# Patient Record
Sex: Female | Born: 1990 | Race: White | Hispanic: No | Marital: Married | Smoking: Never smoker
Health system: Southern US, Community
[De-identification: ages and names within clinical notes are randomized; demographics above are authoritative.]

## PROBLEM LIST (undated history)

## (undated) DIAGNOSIS — N83209 Unspecified ovarian cyst, unspecified side: Secondary | ICD-10-CM

## (undated) DIAGNOSIS — J45909 Unspecified asthma, uncomplicated: Secondary | ICD-10-CM

## (undated) HISTORY — PX: OTHER SURGICAL HISTORY: SHX169

---

## 2017-11-27 ENCOUNTER — Other Ambulatory Visit: Payer: Self-pay

## 2017-11-27 ENCOUNTER — Encounter (HOSPITAL_BASED_OUTPATIENT_CLINIC_OR_DEPARTMENT_OTHER): Payer: Self-pay | Admitting: *Deleted

## 2017-11-27 ENCOUNTER — Emergency Department (HOSPITAL_BASED_OUTPATIENT_CLINIC_OR_DEPARTMENT_OTHER): Payer: 59

## 2017-11-27 ENCOUNTER — Emergency Department (HOSPITAL_BASED_OUTPATIENT_CLINIC_OR_DEPARTMENT_OTHER)
Admission: EM | Admit: 2017-11-27 | Discharge: 2017-11-27 | Disposition: A | Discharge status: Home / Self Care | Payer: 59 | Attending: Emergency Medicine | Admitting: Emergency Medicine

## 2017-11-27 DIAGNOSIS — N83201 Unspecified ovarian cyst, right side: Secondary | ICD-10-CM | POA: Insufficient documentation

## 2017-11-27 DIAGNOSIS — J45909 Unspecified asthma, uncomplicated: Secondary | ICD-10-CM | POA: Diagnosis not present

## 2017-11-27 DIAGNOSIS — R1013 Epigastric pain: Secondary | ICD-10-CM | POA: Diagnosis not present

## 2017-11-27 DIAGNOSIS — R10811 Right upper quadrant abdominal tenderness: Secondary | ICD-10-CM | POA: Insufficient documentation

## 2017-11-27 DIAGNOSIS — R197 Diarrhea, unspecified: Secondary | ICD-10-CM | POA: Diagnosis not present

## 2017-11-27 DIAGNOSIS — R112 Nausea with vomiting, unspecified: Secondary | ICD-10-CM

## 2017-11-27 HISTORY — DX: Unspecified ovarian cyst, unspecified side: N83.209

## 2017-11-27 HISTORY — DX: Unspecified asthma, uncomplicated: J45.909

## 2017-11-27 LAB — COMPREHENSIVE METABOLIC PANEL
ALT: 18 U/L (ref 14–54)
AST: 29 U/L (ref 15–41)
Albumin: 4.4 g/dL (ref 3.5–5.0)
Alkaline Phosphatase: 64 U/L (ref 38–126)
Anion gap: 10 (ref 5–15)
BUN: 14 mg/dL (ref 6–20)
CHLORIDE: 105 mmol/L (ref 101–111)
CO2: 21 mmol/L — AB (ref 22–32)
CREATININE: 0.84 mg/dL (ref 0.44–1.00)
Calcium: 9.2 mg/dL (ref 8.9–10.3)
Glucose, Bld: 113 mg/dL — ABNORMAL HIGH (ref 65–99)
POTASSIUM: 4.1 mmol/L (ref 3.5–5.1)
SODIUM: 136 mmol/L (ref 135–145)
Total Bilirubin: 1.5 mg/dL — ABNORMAL HIGH (ref 0.3–1.2)
Total Protein: 7.5 g/dL (ref 6.5–8.1)

## 2017-11-27 LAB — CBC WITH DIFFERENTIAL/PLATELET
BASOS ABS: 0 10*3/uL (ref 0.0–0.1)
Basophils Relative: 0 %
EOS ABS: 0 10*3/uL (ref 0.0–0.7)
EOS PCT: 0 %
HCT: 41.4 % (ref 36.0–46.0)
Hemoglobin: 14.3 g/dL (ref 12.0–15.0)
Lymphocytes Relative: 3 %
Lymphs Abs: 0.3 10*3/uL — ABNORMAL LOW (ref 0.7–4.0)
MCH: 31.5 pg (ref 26.0–34.0)
MCHC: 34.5 g/dL (ref 30.0–36.0)
MCV: 91.2 fL (ref 78.0–100.0)
MONO ABS: 0.3 10*3/uL (ref 0.1–1.0)
Monocytes Relative: 4 %
Neutro Abs: 8.2 10*3/uL — ABNORMAL HIGH (ref 1.7–7.7)
Neutrophils Relative %: 93 %
PLATELETS: 149 10*3/uL — AB (ref 150–400)
RBC: 4.54 MIL/uL (ref 3.87–5.11)
RDW: 12.1 % (ref 11.5–15.5)
WBC: 8.8 10*3/uL (ref 4.0–10.5)

## 2017-11-27 LAB — URINALYSIS, ROUTINE W REFLEX MICROSCOPIC
BILIRUBIN URINE: NEGATIVE
GLUCOSE, UA: NEGATIVE mg/dL
HGB URINE DIPSTICK: NEGATIVE
KETONES UR: 15 mg/dL — AB
Leukocytes, UA: NEGATIVE
Nitrite: NEGATIVE
PROTEIN: NEGATIVE mg/dL
Specific Gravity, Urine: 1.015 (ref 1.005–1.030)
pH: 6 (ref 5.0–8.0)

## 2017-11-27 LAB — LIPASE, BLOOD: LIPASE: 25 U/L (ref 11–51)

## 2017-11-27 LAB — PREGNANCY, URINE: Preg Test, Ur: NEGATIVE

## 2017-11-27 MED ORDER — ONDANSETRON 4 MG PO TBDP
ORAL_TABLET | ORAL | Status: AC
  Filled 2017-11-27: qty 1

## 2017-11-27 MED ORDER — ONDANSETRON HCL 4 MG/2ML IJ SOLN
4.0000 mg | Freq: Once | INTRAMUSCULAR | Status: AC
  Administered 2017-11-27: 4 mg via INTRAVENOUS
  Filled 2017-11-27: qty 2

## 2017-11-27 MED ORDER — ONDANSETRON 4 MG PO TBDP
4.0000 mg | ORAL_TABLET | Freq: Once | ORAL | Status: AC
  Administered 2017-11-27: 4 mg via ORAL

## 2017-11-27 MED ORDER — IOPAMIDOL (ISOVUE-300) INJECTION 61%
100.0000 mL | Freq: Once | INTRAVENOUS | Status: AC | PRN
  Administered 2017-11-27: 100 mL via INTRAVENOUS

## 2017-11-27 MED ORDER — SODIUM CHLORIDE 0.9 % IV BOLUS (SEPSIS)
1000.0000 mL | Freq: Once | INTRAVENOUS | Status: AC
  Administered 2017-11-27: 1000 mL via INTRAVENOUS

## 2017-11-27 MED ORDER — DICYCLOMINE HCL 20 MG PO TABS: 20.0000 mg | ORAL_TABLET | Freq: Two times a day (BID) | ORAL | 0 refills | Status: AC | PRN

## 2017-11-27 MED ORDER — ONDANSETRON 4 MG PO TBDP: 4.0000 mg | ORAL_TABLET | Freq: Three times a day (TID) | ORAL | 0 refills | Status: AC | PRN

## 2017-11-27 MED ORDER — DICYCLOMINE HCL 10 MG/ML IM SOLN
20.0000 mg | Freq: Once | INTRAMUSCULAR | Status: AC
  Administered 2017-11-27: 20 mg via INTRAMUSCULAR
  Filled 2017-11-27: qty 2

## 2017-11-27 MED ORDER — MORPHINE SULFATE (PF) 4 MG/ML IV SOLN
4.0000 mg | Freq: Once | INTRAVENOUS | Status: AC
  Administered 2017-11-27: 4 mg via INTRAVENOUS
  Filled 2017-11-27: qty 1

## 2017-11-27 MED ORDER — HALOPERIDOL LACTATE 5 MG/ML IJ SOLN
2.0000 mg | Freq: Once | INTRAMUSCULAR | Status: AC
  Administered 2017-11-27: 2 mg via INTRAVENOUS
  Filled 2017-11-27: qty 1

## 2017-11-27 MED ORDER — MORPHINE SULFATE (PF) 4 MG/ML IV SOLN
INTRAVENOUS | Status: AC
  Administered 2017-11-27: 4 mg
  Filled 2017-11-27: qty 1

## 2017-11-27 NOTE — ED Notes (Signed)
Patient transported to Ultrasound 

## 2017-11-27 NOTE — ED Notes (Signed)
Pt and friend given d/c instructions as per chart. Rx x 2. Verbalizes understanding. No questions. 

## 2017-11-27 NOTE — ED Triage Notes (Addendum)
C/o vomiting and diarrhea that started last night and abd pain started this morning. Describes as sharp pain and difficult taking a deep breath. C/o chills. Denies any urinary symptoms. States she had the same symptoms 2 months ago and was recommended to have a CT scan to r/o her gallbladder  but symptoms improved. Did eat greasy pizza last night.

## 2017-11-27 NOTE — ED Notes (Signed)
Up to BR.

## 2017-11-27 NOTE — ED Notes (Signed)
Upon return to room, pt states she feels better and is pill was still under her tongue. "Ready to be d/c'd."

## 2017-11-27 NOTE — ED Notes (Addendum)
Patient transported to CT 

## 2017-11-27 NOTE — ED Notes (Signed)
ED Provider at bedside. 

## 2017-11-27 NOTE — ED Notes (Signed)
Went to d/c pt and she began c/o nausea. Administered Zofran, but a couple minutes later, she began vomiting again x 1. No pill fragment noted in clear emesis.

## 2017-11-27 NOTE — ED Notes (Signed)
Pt reminded about the need of a urine sample. States she will provide urine once she gets her IV fluids.

## 2017-11-27 NOTE — ED Notes (Signed)
Patient is resting comfortably. 

## 2017-11-27 NOTE — ED Provider Notes (Signed)
[facility] EMERGENCY DEPARTMENT Provider Note   CSN: [id number] Arrival date & time: 11/27/17  1000     History   Chief Complaint Chief Complaint  Patient presents with  . Abdominal Pain    HPI Theresa Dunn is a 27 y.o. female.  HPI   27 year old female presents with concern for nausea, vomiting, diarrhea, and severe epigastric and right upper quadrant abdominal pain that began this morning.  Patient reports that 2 months ago she had similar symptoms, was told to have an evaluation to look at her gallbladder, but did not follow-up.  Reports she had greasy pizza last night, and developed these symptoms this morning.  Reports she has chills, but denies fevers.  Reports she is vomited nearly 40 times, also reports she has had diarrhea similarly.  She denies significant recent alcohol use, NSAID use, drug use.  She denies dysuria, vaginal discharge or other concerns.    Past Medical History:  Diagnosis Date  . Asthma   . Ovarian cyst     There are no active problems to display for this patient.   Past Surgical History:  Procedure Laterality Date  . lymph node removal       OB History    No data available       Home Medications    Prior to Admission medications   Medication Sig Start Date End Date Taking? Authorizing Provider  dicyclomine (BENTYL) 20 MG tablet Take 1 tablet (20 mg total) by mouth 2 (two) times daily as needed for spasms (abdominal pain). 11/27/17   Alvira MondaySchlossman, Hendry Speas, MD  ondansetron (ZOFRAN ODT) 4 MG disintegrating tablet Take 1 tablet (4 mg total) by mouth every 8 (eight) hours as needed for nausea or vomiting. 11/27/17   Alvira MondaySchlossman, Ralf Konopka, MD    Family History No family history on file.  Social History Social History   Tobacco Use  . Smoking status: Never Smoker  . Smokeless tobacco: Never Used  Substance Use Topics  . Alcohol use: No    Frequency: Never  . Drug use: No     Allergies   Penicillins   Review of Systems Review  of Systems  Constitutional: Negative for fever.  HENT: Negative for sore throat.   Eyes: Negative for visual disturbance.  Respiratory: Negative for cough and shortness of breath.   Cardiovascular: Negative for chest pain.  Gastrointestinal: Positive for abdominal pain, diarrhea, nausea and vomiting.  Genitourinary: Negative for difficulty urinating and dysuria.  Musculoskeletal: Negative for back pain and neck pain.  Skin: Negative for rash.  Neurological: Negative for syncope and headaches.     Physical Exam Updated Vital Signs BP (!) 100/58 (BP Location: Right Arm)   Pulse 99   Temp 99.6 F (37.6 C) (Oral)   Resp 16   Ht 5\' 8"  (1.727 m)   Wt 61.2 kg (135 lb)   LMP 11/17/2017 (Approximate)   SpO2 100%   BMI 20.53 kg/m   Physical Exam  Constitutional: She is oriented to person, place, and time. She appears well-developed and well-nourished. She appears distressed.  HENT:  Head: Normocephalic and atraumatic.  Eyes: Conjunctivae and EOM are normal.  Neck: Normal range of motion.  Cardiovascular: Normal rate, regular rhythm, normal heart sounds and intact distal pulses. Exam reveals no gallop and no friction rub.  No murmur heard. Pulmonary/Chest: Effort normal and breath sounds normal. No respiratory distress. She has no wheezes. She has no rales.  Abdominal: Soft. She exhibits no distension. There is tenderness in  the right upper quadrant, epigastric area and left lower quadrant. There is no guarding and negative Murphy's sign.  Musculoskeletal: She exhibits no edema or tenderness.  Neurological: She is alert and oriented to person, place, and time.  Skin: Skin is warm and dry. No rash noted. She is not diaphoretic. No erythema.  Nursing note and vitals reviewed.    ED Treatments / Results  Labs (all labs ordered are listed, but only abnormal results are displayed) Labs Reviewed  URINALYSIS, ROUTINE W REFLEX MICROSCOPIC - Abnormal; Notable for the following  components:      Result Value   Ketones, ur 15 (*)    All other components within normal limits  CBC WITH DIFFERENTIAL/PLATELET - Abnormal; Notable for the following components:   Platelets 149 (*)    Neutro Abs 8.2 (*)    Lymphs Abs 0.3 (*)    All other components within normal limits  COMPREHENSIVE METABOLIC PANEL - Abnormal; Notable for the following components:   CO2 21 (*)    Glucose, Bld 113 (*)    Total Bilirubin 1.5 (*)    All other components within normal limits  PREGNANCY, URINE  LIPASE, BLOOD    EKG  EKG Interpretation None       Radiology Ct Abdomen Pelvis W Contrast  Result Date: 11/27/2017 CLINICAL DATA:  Vomiting and diarrhea started last night. Pain through the midriff of the body. EXAM: CT ABDOMEN AND PELVIS WITH CONTRAST TECHNIQUE: Multidetector CT imaging of the abdomen and pelvis was performed using the standard protocol following bolus administration of intravenous contrast. CONTRAST:  ISOVUE-300 IOPAMIDOL (ISOVUE-300) INJECTION 61% COMPARISON:  Right upper quadrant ultrasound 11/27/2017 FINDINGS: Lower chest: No acute abnormality. Hepatobiliary: No focal liver abnormality is seen. No radiopaque gallstones, biliary dilatation, or pericholecystic inflammatory changes. Pancreas: Unremarkable. No pancreatic ductal dilatation or surrounding inflammatory changes. Spleen: Normal in size without focal abnormality. Adrenals/Urinary Tract: Adrenal glands are unremarkable. Kidneys are normal, without renal calculi, focal lesion, or hydronephrosis. Bladder is unremarkable. Stomach/Bowel: The stomach and small bowel loops are normal in appearance. The appendix is well seen and has a normal appearance. The colon is normal in appearance. Vascular/Lymphatic: No significant vascular findings are present. No enlarged abdominal or pelvic lymph nodes. Reproductive: The uterus is present. Right ovarian cystic lesion is 1.8 centimeters. Region of the left ovary is normal in  appearance. There is no free pelvic fluid. Other: Anterior abdominal wall is unremarkable. Musculoskeletal: No acute or significant osseous findings. IMPRESSION: 1. No bowel obstruction or abscess. 2. Normal appendix. 3. Right ovarian cystic lesion is 1.8 centimeters, possibly accounting for the patient's pain. Consider further evaluation with pelvic ultrasound. Electronically Signed   By: Norva Pavlov M.D.   On: 11/27/2017 14:26   US Abdomen Limited Ruq  Result Date: 11/27/2017 CLINICAL DATA:  Postprandial upper abdominal pain, nausea vomiting and diarrhea. EXAM: ULTRASOUND ABDOMEN LIMITED RIGHT UPPER QUADRANT COMPARISON:  None. FINDINGS: Gallbladder: No gallstones or wall thickening visualized. No sonographic Murphy sign noted by sonographer. Common bile duct: Diameter: 1 mm Liver: No focal lesion identified. Within normal limits in parenchymal echogenicity. Portal vein is patent on color Doppler imaging with normal direction of blood flow towards the liver. IMPRESSION: Normal right upper quadrant ultrasound. Electronically Signed   By: Ted Mcalpine M.D.   On: 11/27/2017 12:07    Procedures Procedures (including critical care time)  Medications Ordered in ED Medications  sodium chloride 0.9 % bolus 1,000 mL (0 mLs Intravenous Stopped 11/27/17 1224)  ondansetron (  ZOFRAN) injection 4 mg (4 mg Intravenous Given 11/27/17 1114)  morphine 4 MG/ML injection 4 mg (4 mg Intravenous Given 11/27/17 1125)  morphine 4 MG/ML injection (4 mg  Given 11/27/17 1130)  dicyclomine (BENTYL) injection 20 mg (20 mg Intramuscular Given 11/27/17 1304)  haloperidol lactate (HALDOL) injection 2 mg (2 mg Intravenous Given 11/27/17 1304)  sodium chloride 0.9 % bolus 1,000 mL (0 mLs Intravenous Stopped 11/27/17 1432)  iopamidol (ISOVUE-300) 61 % injection 100 mL (100 mLs Intravenous Contrast Given 11/27/17 1405)  ondansetron (ZOFRAN-ODT) disintegrating tablet 4 mg (4 mg Oral Given 11/27/17 1526)     Initial Impression /  Assessment and Plan / ED Course  I have reviewed the triage vital signs and the nursing notes.  Pertinent labs & imaging results that were available during my care of the patient were reviewed by me and considered in my medical decision making (see chart for details).     27 year old female presents with concern for nausea, vomiting, diarrhea, and severe epigastric and right upper quadrant abdominal pain that began this morning.  Right upper quadrant ultrasound shows no evidence of cholecystitis.  Labs show no transaminitis, no signs of pancreatitis.  She has no significant electrolyte abnormalities.  On reevaluation, patient reports continuing pain, now reporting that is more severe in her left lower quadrant, has focal tenderness on exam. CT abdomen ordered to evaluate for diverticulitis or other acute pathology such as perforation.  CT shows no significant findings.  Patient noted to have a right ovarian cyst measuring 1.8 cm.  Given she has no right lower quadrant pain, significant other symptoms of vomiting and diarrhea, and is comfortable at this time, have low suspicion for ovarian torsion.  She does not have any other pelvic concerns, and have low suspicion for PID, TOA.  Suspect patient's pain and other symptoms are likely secondary to a viral gastroenteritis.  She was hydrated in the emergency department, given Haldol, Bentyl, Zofran with improvement.  She is given a prescription for Zofran and Bentyl. Patient discharged in stable condition with understanding of reasons to return.   Final Clinical Impressions(s) / ED Diagnoses   Final diagnoses:  RUQ abdominal tenderness  Nausea vomiting and diarrhea  Cyst of right ovary    ED Discharge Orders        Ordered    dicyclomine (BENTYL) 20 MG tablet  2 times daily PRN     11/27/17 1512    ondansetron (ZOFRAN ODT) 4 MG disintegrating tablet  Every 8 hours PRN     11/27/17 1512       Alvira Monday, MD 11/27/17 1800

## 2017-11-27 NOTE — ED Notes (Signed)
Pt unable to give UA at this time 

## 2019-03-09 IMAGING — CT CT ABD-PELV W/ CM
2 of 4 series · 16 of 46 positions shown, 18 images · IV contrast (APPLIED)
Comparison: Right upper quadrant ultrasound 11/27/2017

CLINICAL DATA: Vomiting and diarrhea started last night. Pain
through the midriff of the body.

EXAM:
CT ABDOMEN AND PELVIS WITH CONTRAST
TECHNIQUE: Multidetector CT imaging of the abdomen and pelvis was performed
using the standard protocol following bolus administration of
intravenous contrast.
CONTRAST:  100mL [id number]-GAA IOPAMIDOL (G8GO4W-GAA) INJECTION 61%

[Series 2: axial st · axial · 0.70mm/px · z∈[-442,-32]mm · 13 of 90 slices shown, 15 images]
[im 4/90  soft-tissue]
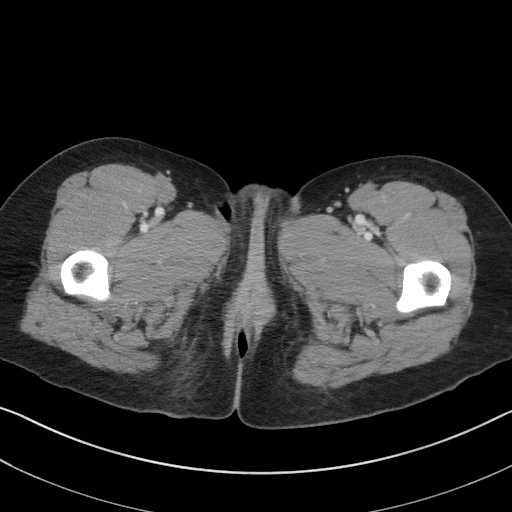
[im 4/90  bone]
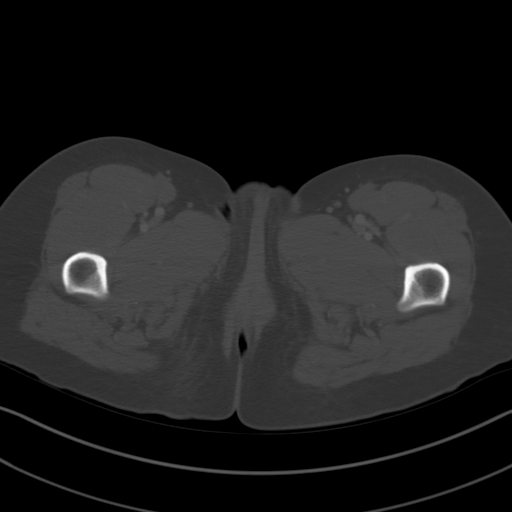
[im 11/90  soft-tissue]
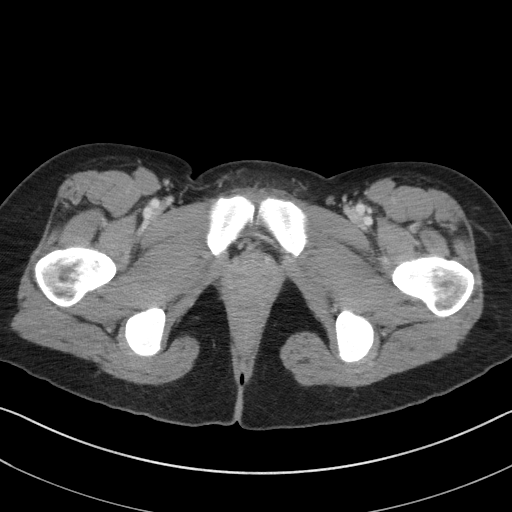
[im 18/90  soft-tissue]
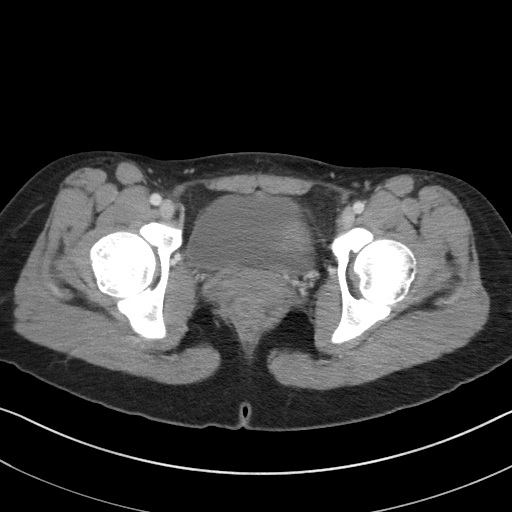
[im 24/90  soft-tissue]
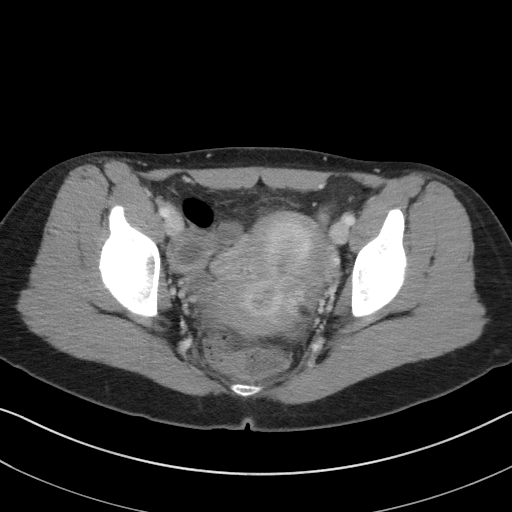
[im 31/90  soft-tissue]
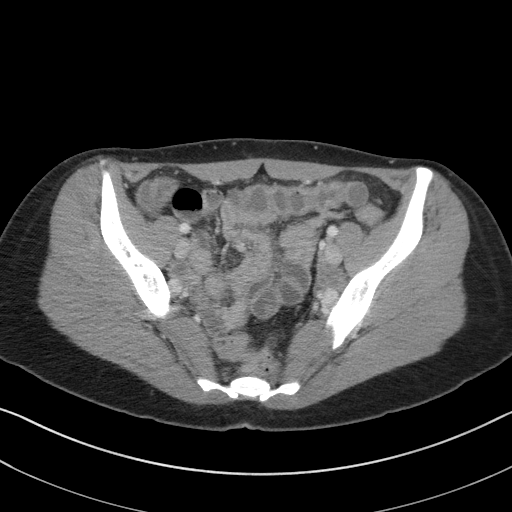
[im 38/90  soft-tissue]
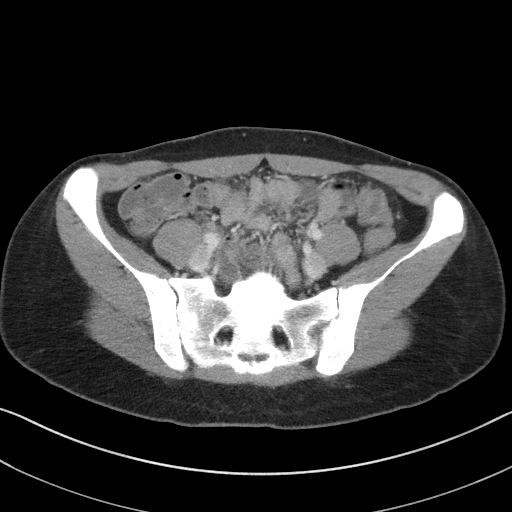
[im 45/90  soft-tissue]
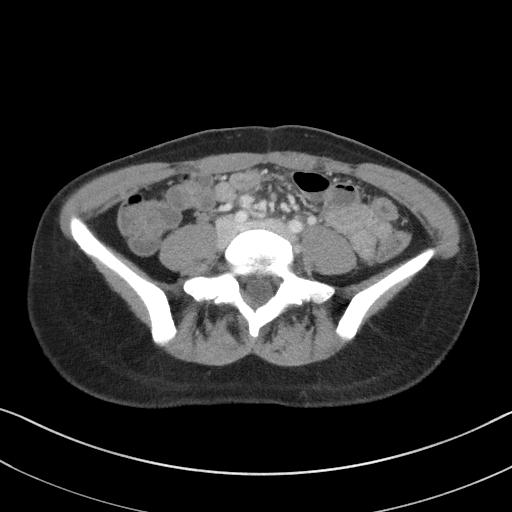
[im 52/90  soft-tissue]
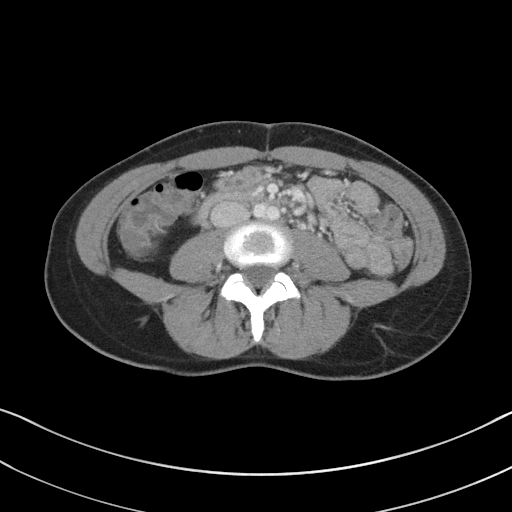
[im 59/90  soft-tissue]
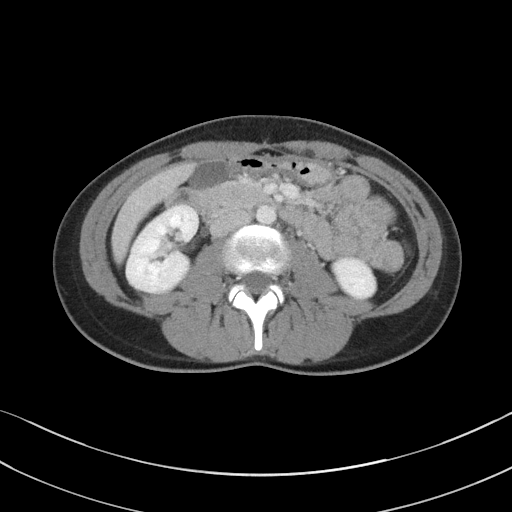
[im 59/90  bone]
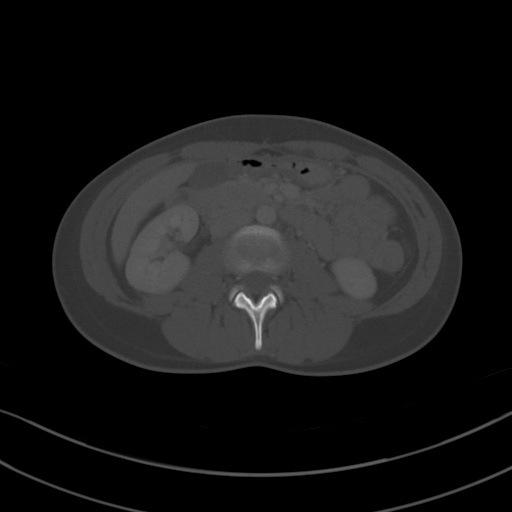
[im 66/90  soft-tissue]
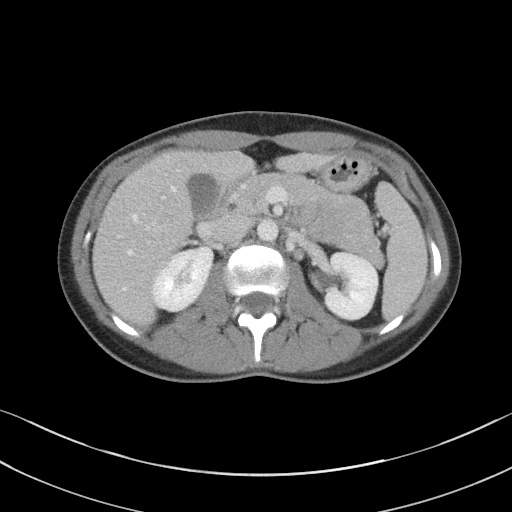
[im 72/90  soft-tissue]
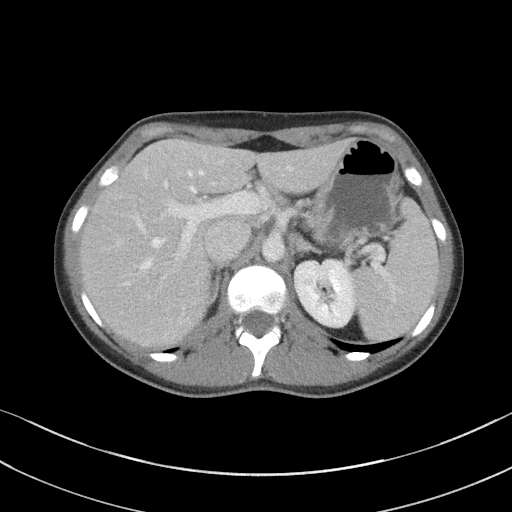
[im 79/90  soft-tissue]
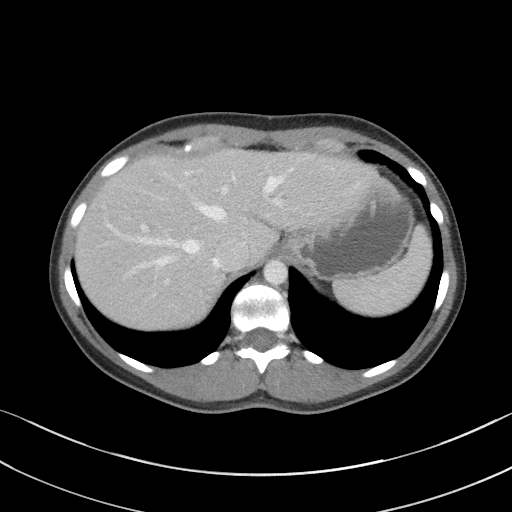
[im 86/90  soft-tissue]
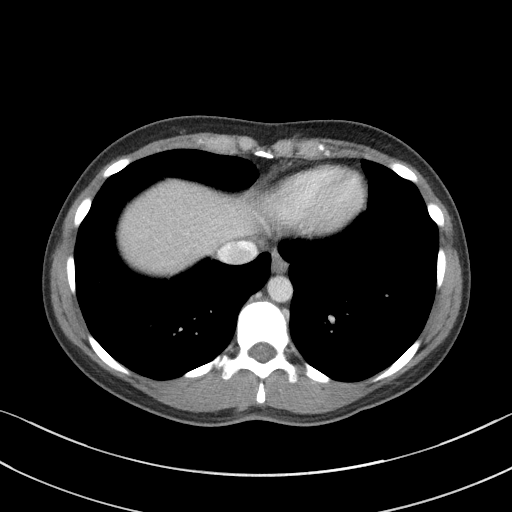

[Series 5: coronal st · coronal · 0.71mm/px · 3 of 62 slices shown]
[im 21/62  soft-tissue]
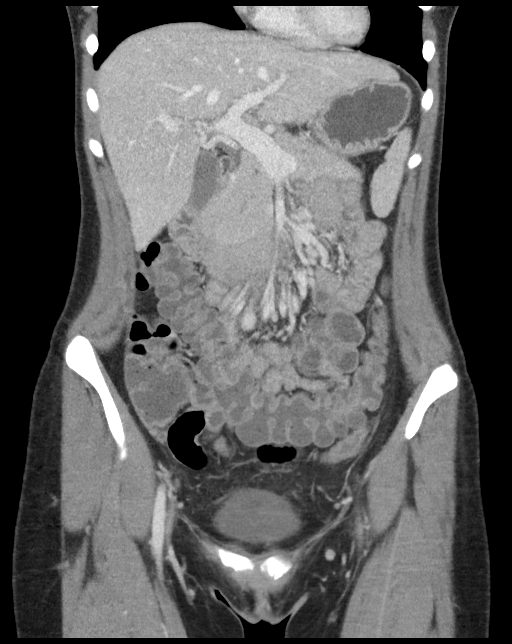
[im 28/62  soft-tissue]
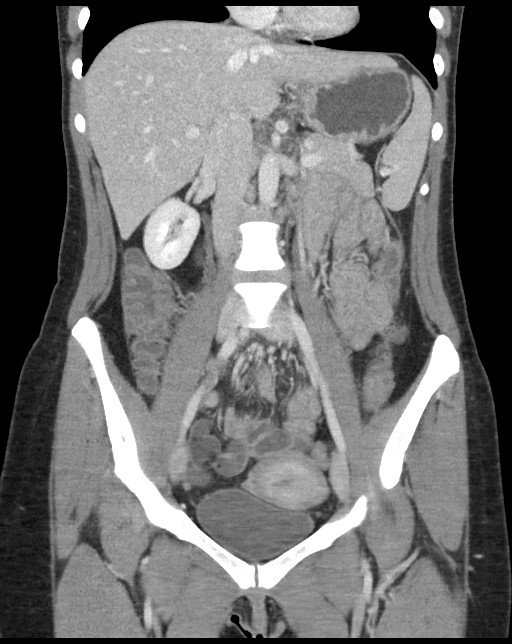
[im 34/62  soft-tissue]
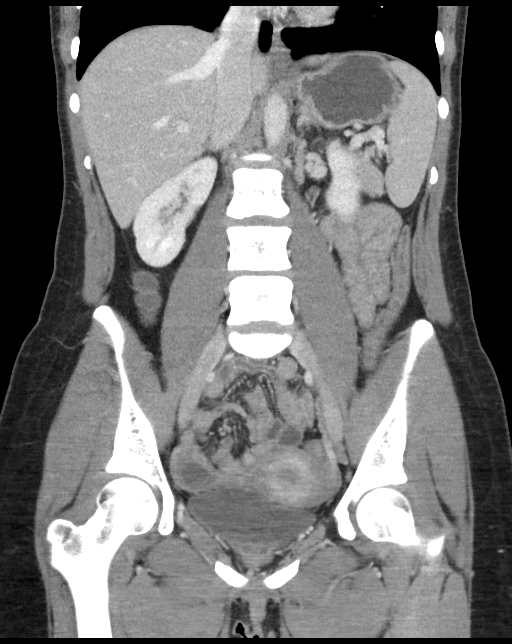

[16 of 46 positions shown; findings below may reference images not displayed]

FINDINGS: Lower chest: No acute abnormality.

Hepatobiliary: No focal liver abnormality is seen. No radiopaque
gallstones, biliary dilatation, or pericholecystic inflammatory
changes.

Pancreas: Unremarkable. No pancreatic ductal dilatation or
surrounding inflammatory changes.

Spleen: Normal in size without focal abnormality.

Adrenals/Urinary Tract: Adrenal glands are unremarkable. Kidneys are
normal, without renal calculi, focal lesion, or hydronephrosis.
Bladder is unremarkable.

Stomach/Bowel: The stomach and small bowel loops are normal in
appearance. The appendix is well seen and has a normal appearance.
The colon is normal in appearance.

Vascular/Lymphatic: No significant vascular findings are present. No
enlarged abdominal or pelvic lymph nodes.

Reproductive: The uterus is present. Right ovarian cystic lesion is
1.8 centimeters. Region of the left ovary is normal in appearance.
There is no free pelvic fluid.

Other: Anterior abdominal wall is unremarkable.

Musculoskeletal: No acute or significant osseous findings.
IMPRESSION: 1. No bowel obstruction or abscess.
2. Normal appendix.
3. Right ovarian cystic lesion is 1.8 centimeters, possibly
accounting for the patient's pain. Consider further evaluation with
pelvic ultrasound.

## 2019-10-17 IMAGING — US US ABDOMEN LIMITED
1 series · 14 of 25 positions shown · non-contrast
Comparison: None.

CLINICAL DATA: Postprandial upper abdominal pain, nausea vomiting
and diarrhea.

EXAM:
ULTRASOUND ABDOMEN LIMITED RIGHT UPPER QUADRANT

[Series 1: us abdomen limited · 0.17mm/px · 14 of 31 slices shown]
[im 1/31]
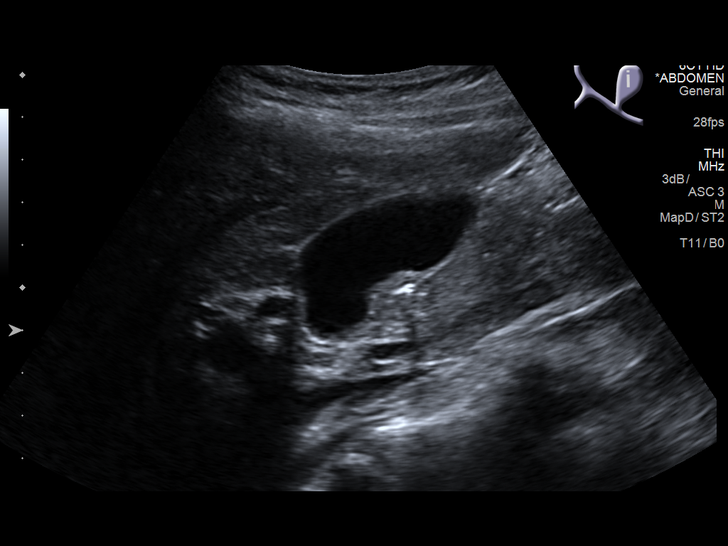
[im 3/31]
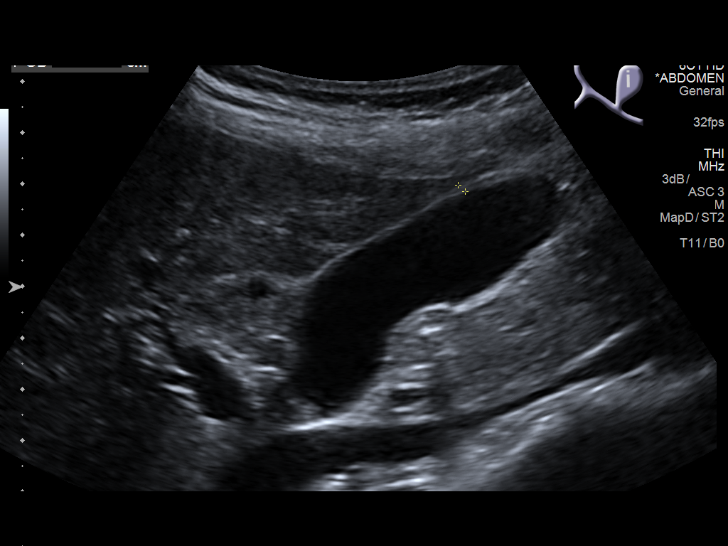
[im 6/31]
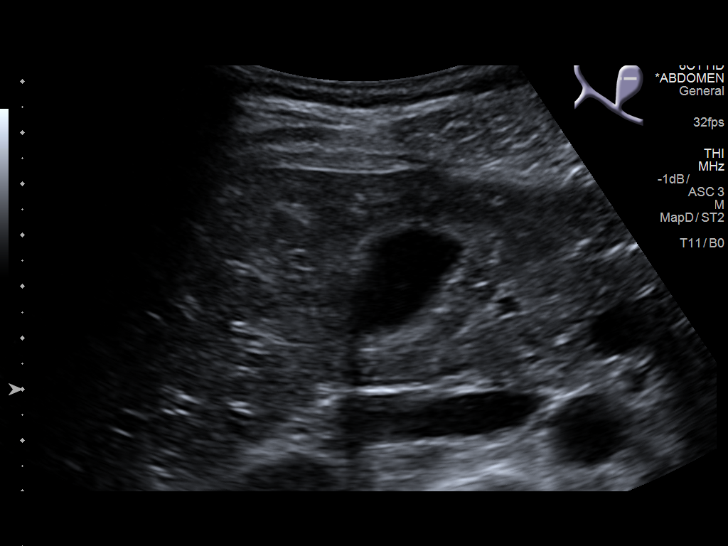
[im 8/31]
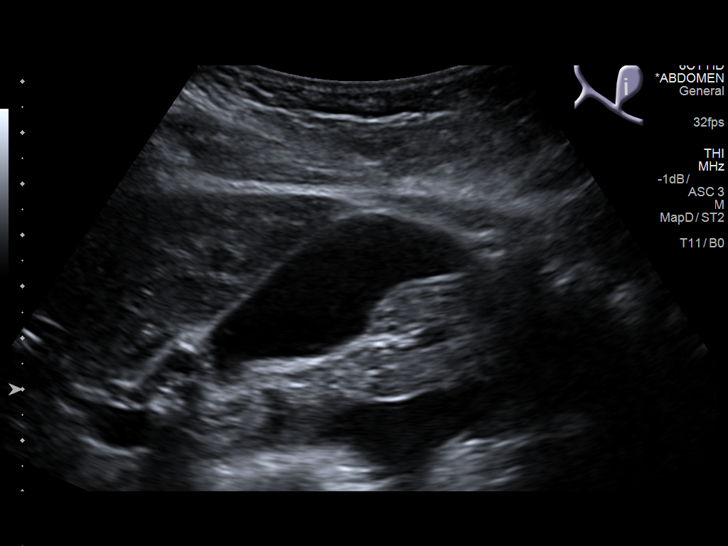
[im 11/31]
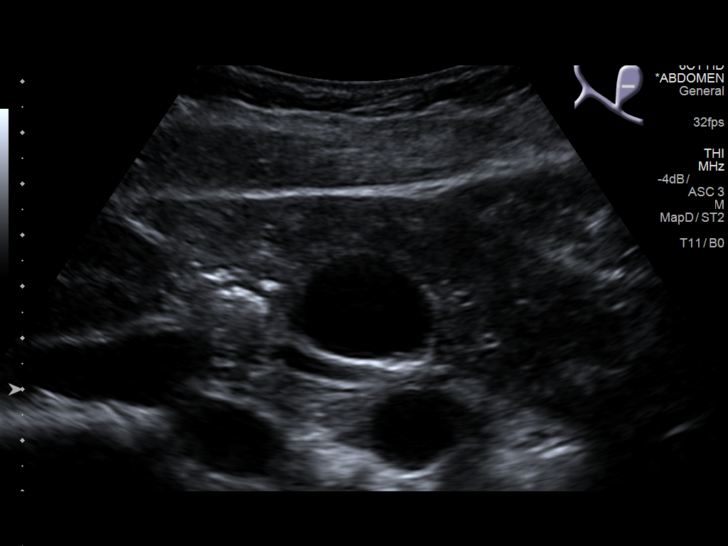
[im 12/31]
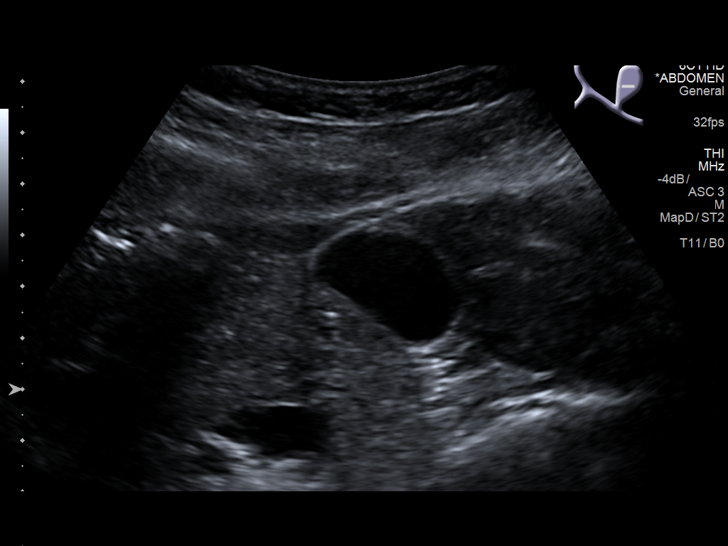
[im 14/31]
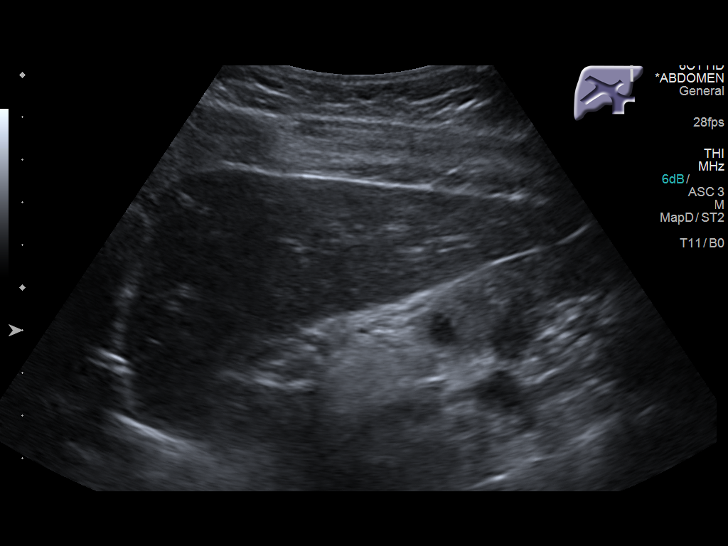
[im 17/31]
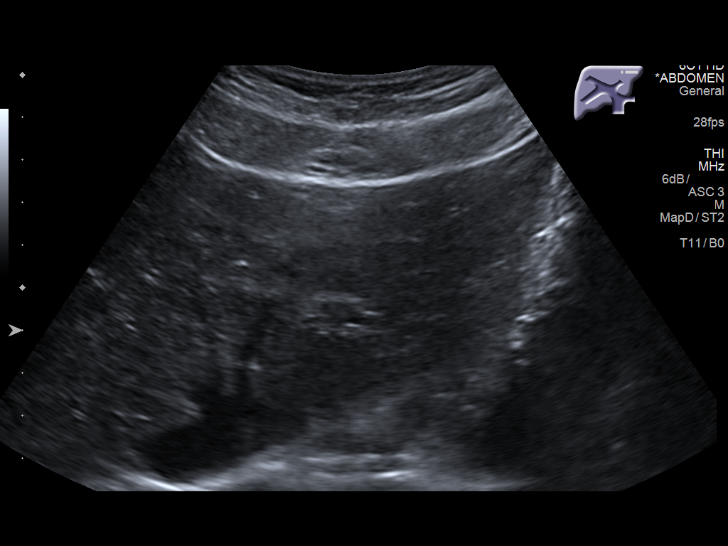
[im 19/31]
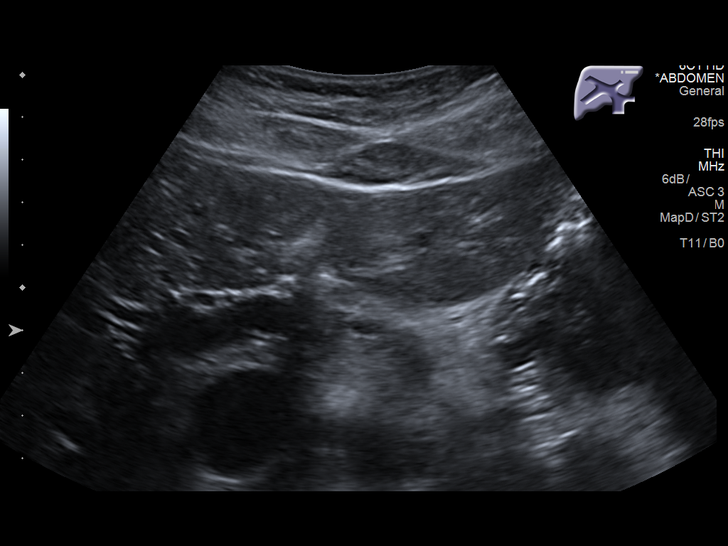
[im 21/31]
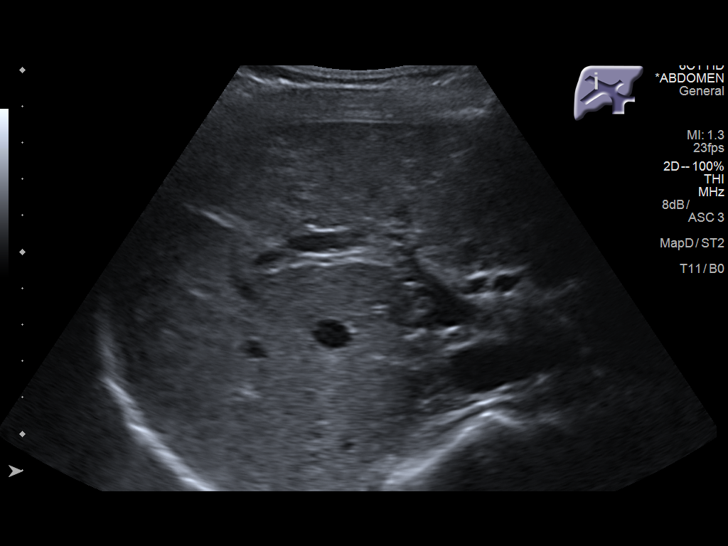
[im 23/31]
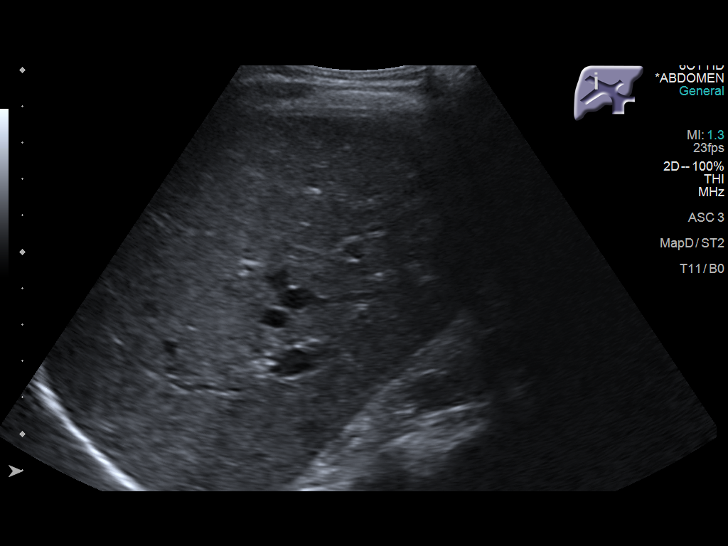
[im 26/31]
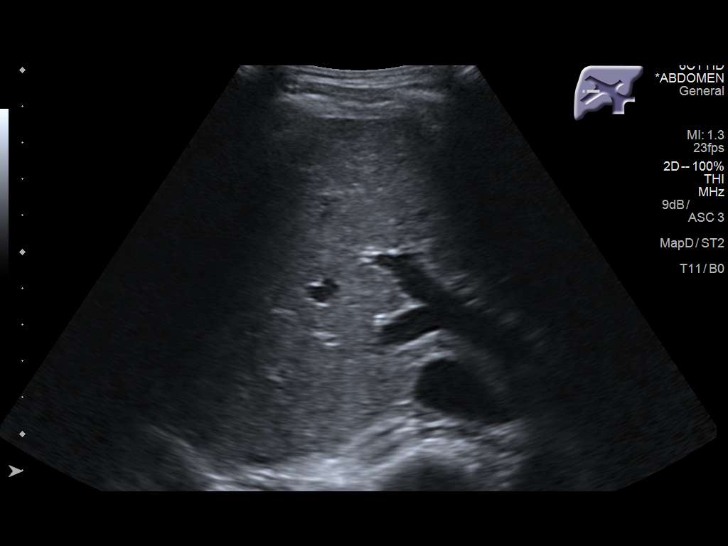
[im 28/31]
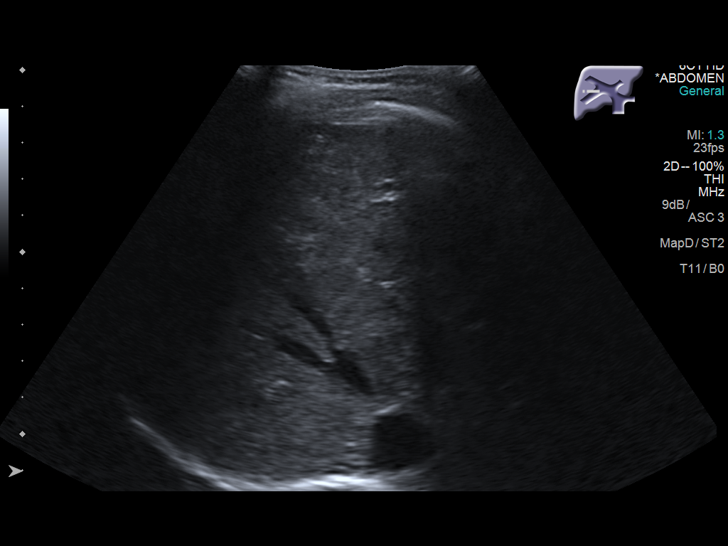
[im 31/31]
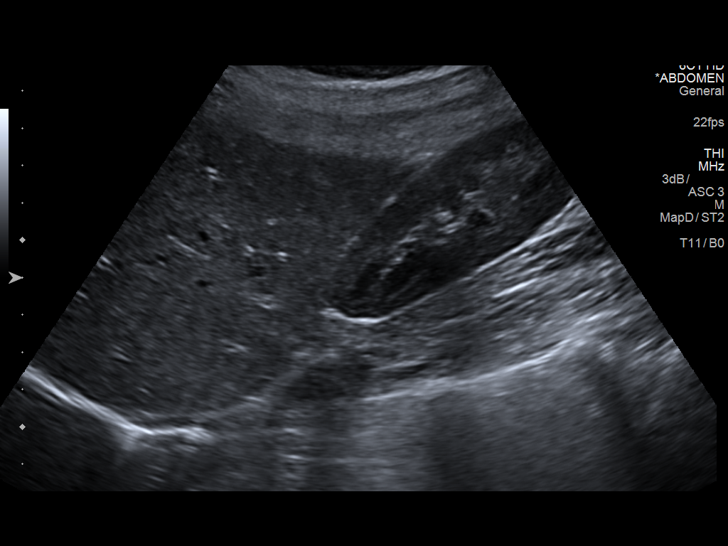

[14 of 25 positions shown; findings below may reference images not displayed]

FINDINGS: Gallbladder:

No gallstones or wall thickening visualized. No sonographic Murphy
sign noted by sonographer.

Common bile duct:

Diameter: 1 mm

Liver:

No focal lesion identified. Within normal limits in parenchymal
echogenicity. Portal vein is patent on color Doppler imaging with
normal direction of blood flow towards the liver.
IMPRESSION: Normal right upper quadrant ultrasound.
